# Patient Record
Sex: Male | Born: 1965 | Race: White | Hispanic: No | Marital: Married | Smoking: Former smoker
Health system: Southern US, Community
[De-identification: ages and names within clinical notes are randomized; demographics above are authoritative.]

## PROBLEM LIST (undated history)

## (undated) DIAGNOSIS — K219 Gastro-esophageal reflux disease without esophagitis: Secondary | ICD-10-CM

## (undated) DIAGNOSIS — D6859 Other primary thrombophilia: Secondary | ICD-10-CM

## (undated) DIAGNOSIS — J342 Deviated nasal septum: Secondary | ICD-10-CM

## (undated) DIAGNOSIS — H349 Unspecified retinal vascular occlusion: Secondary | ICD-10-CM

## (undated) DIAGNOSIS — Z8673 Personal history of transient ischemic attack (TIA), and cerebral infarction without residual deficits: Secondary | ICD-10-CM

## (undated) DIAGNOSIS — I639 Cerebral infarction, unspecified: Secondary | ICD-10-CM

## (undated) DIAGNOSIS — E782 Mixed hyperlipidemia: Secondary | ICD-10-CM

## (undated) HISTORY — DX: Other primary thrombophilia: D68.59

## (undated) HISTORY — DX: Mixed hyperlipidemia: E78.2

## (undated) HISTORY — DX: Unspecified retinal vascular occlusion: H34.9

## (undated) HISTORY — DX: Gastro-esophageal reflux disease without esophagitis: K21.9

## (undated) HISTORY — DX: Cerebral infarction, unspecified: I63.9

## (undated) HISTORY — PX: OTHER SURGICAL HISTORY: SHX169

## (undated) HISTORY — DX: Deviated nasal septum: J34.2

## (undated) HISTORY — DX: Personal history of transient ischemic attack (TIA), and cerebral infarction without residual deficits: Z86.73

---

## 2007-08-20 ENCOUNTER — Ambulatory Visit: Payer: Self-pay | Admitting: Unknown Physician Specialty

## 2009-10-06 ENCOUNTER — Ambulatory Visit: Payer: Self-pay | Admitting: Family Medicine

## 2010-12-08 ENCOUNTER — Ambulatory Visit: Payer: Self-pay | Admitting: Internal Medicine

## 2016-04-02 DIAGNOSIS — I639 Cerebral infarction, unspecified: Secondary | ICD-10-CM | POA: Insufficient documentation

## 2016-04-03 DIAGNOSIS — D649 Anemia, unspecified: Secondary | ICD-10-CM | POA: Insufficient documentation

## 2016-04-04 DIAGNOSIS — I7771 Dissection of carotid artery: Secondary | ICD-10-CM | POA: Insufficient documentation

## 2016-04-12 DIAGNOSIS — Z8673 Personal history of transient ischemic attack (TIA), and cerebral infarction without residual deficits: Secondary | ICD-10-CM | POA: Insufficient documentation

## 2016-04-12 DIAGNOSIS — H3410 Central retinal artery occlusion, unspecified eye: Secondary | ICD-10-CM | POA: Insufficient documentation

## 2016-04-21 DIAGNOSIS — Z8679 Personal history of other diseases of the circulatory system: Secondary | ICD-10-CM | POA: Insufficient documentation

## 2016-05-10 ENCOUNTER — Encounter: Payer: Self-pay | Admitting: *Deleted

## 2016-05-10 ENCOUNTER — Other Ambulatory Visit: Payer: Self-pay | Admitting: *Deleted

## 2016-05-10 ENCOUNTER — Encounter: Payer: Self-pay | Admitting: Internal Medicine

## 2016-05-10 ENCOUNTER — Inpatient Hospital Stay: Payer: 59 | Attending: Internal Medicine | Admitting: Internal Medicine

## 2016-05-10 VITALS — BP 132/77 | HR 78 | Temp 98.3°F | Resp 18 | Wt 177.9 lb

## 2016-05-10 DIAGNOSIS — I7771 Dissection of carotid artery: Secondary | ICD-10-CM | POA: Insufficient documentation

## 2016-05-10 DIAGNOSIS — Z87891 Personal history of nicotine dependence: Secondary | ICD-10-CM | POA: Diagnosis not present

## 2016-05-10 DIAGNOSIS — Z7982 Long term (current) use of aspirin: Secondary | ICD-10-CM | POA: Insufficient documentation

## 2016-05-10 DIAGNOSIS — E782 Mixed hyperlipidemia: Secondary | ICD-10-CM | POA: Diagnosis not present

## 2016-05-10 DIAGNOSIS — Z8673 Personal history of transient ischemic attack (TIA), and cerebral infarction without residual deficits: Secondary | ICD-10-CM | POA: Insufficient documentation

## 2016-05-10 DIAGNOSIS — Z79899 Other long term (current) drug therapy: Secondary | ICD-10-CM | POA: Diagnosis not present

## 2016-05-10 DIAGNOSIS — D6859 Other primary thrombophilia: Secondary | ICD-10-CM | POA: Insufficient documentation

## 2016-05-10 DIAGNOSIS — K219 Gastro-esophageal reflux disease without esophagitis: Secondary | ICD-10-CM | POA: Insufficient documentation

## 2016-05-10 NOTE — Progress Notes (Signed)
[facility] CONSULT NOTE  No care team member to display  CHIEF COMPLAINTS/PURPOSE OF CONSULTATION:   # RIGHT MCA stroke   HISTORY OF PRESENTING ILLNESS:  Curtis Mcknight 50 y.o.  male with no significant past medical history noted to have sudden onset of right visual defect; subsequently right MCA stroke with left-sided weakness/neurologic deficits. CTA showed complete occlusion of the right internal carotid artery status post thrombectomy; balloon angioplasty and stent for the ICA dissection. Patient is currently on aspirin and Plavix. He is currently at home; recovered most of his neurologic deficits.  He has been referred to hematology for further evaluation. patient has not previous history of strokes. Has not had any Family history of any venous thromboembolic. No previous history of any DVTs or PEs. He does not smoke; no history of hypertension.  ROS: A complete 10 point review of system is done which is negative except mentioned above in history of present illness  MEDICAL HISTORY:  Past Medical History  Diagnosis Date  . History of stroke   . GERD (gastroesophageal reflux disease)   . Arterial ischemic stroke (HCC)   . Deviated septum   . Mixed hyperlipidemia   . Retinal artery occlusion     SURGICAL HISTORY: Past Surgical History  Procedure Laterality Date  . Internal carotid artery disection      SOCIAL HISTORY: Lives in [place]; No smoking occasional alcohol. Lives with family. Works for [employer] Social History   Social History  . Marital Status: Married    Spouse Name: N/A  . Number of Children: N/A  . Years of Education: N/A   Occupational History  . Not on file.   Social History Main Topics  . Smoking status: Former Smoker -- 0.50 packs/day for 1 years    Types: Cigarettes    Quit date: 01/06/1984  . Smokeless tobacco: Never Used  . Alcohol Use: No  . Drug Use: No  . Sexual Activity: Not on file   Other Topics Concern  . Not on file    Social History Narrative    FAMILY HISTORY: Family History  Problem Relation Age of Onset  . CAD Maternal Grandmother   . CAD Mother   . Glaucoma Maternal Grandfather   . Stroke Maternal Grandfather     ALLERGIES:  has No Known Allergies.  MEDICATIONS:  Current Outpatient Prescriptions  Medication Sig Dispense Refill  . aspirin EC 81 MG tablet Take 1 tablet by mouth daily.    Marland Kitchen atorvastatin (LIPITOR) 40 MG tablet Take 1 tablet by mouth daily.  5  . chlorproMAZINE (THORAZINE) 25 MG tablet Take 1 tablet by mouth 3 (three) times daily.  0  . clopidogrel (PLAVIX) 75 MG tablet Take 1 tablet by mouth daily.    . metoCLOPramide (REGLAN) 10 MG tablet Take 1 tablet by mouth 3 (three) times daily.  0  . Multiple Vitamins-Minerals (MULTIVITAMIN WITH MINERALS) tablet Take 1 tablet by mouth daily.    . pantoprazole (PROTONIX) 40 MG tablet Take 1 tablet by mouth daily.  11   No current facility-administered medications for this visit.      Marland Kitchen  PHYSICAL EXAMINATION:   Filed Vitals:   05/10/16 1130  BP: 132/77  Pulse: 78  Temp: 98.3 F (36.8 C)   Filed Weights   05/10/16 1130  Weight: 177 lb 14.6 oz (80.7 kg)    GENERAL: Well-nourished well-developed; Alert, no distress and comfortable.   Alone.  EYES: no pallor or icterus OROPHARYNX: no thrush  or ulceration; good dentition  NECK: supple, no masses felt LYMPH:  no palpable lymphadenopathy in the cervical, axillary or inguinal regions LUNGS: clear to auscultation and  No wheeze or crackles HEART/CVS: regular rate & rhythm and no murmurs; No lower extremity edema ABDOMEN: abdomen soft, non-tender and normal bowel sounds Musculoskeletal:no cyanosis of digits and no clubbing  PSYCH: alert & oriented x 3 with fluent speech NEURO: no focal motor/sensory deficits SKIN:  no rashes or significant lesions  LABORATORY DATA:  I have reviewed the data as listed No results found for: WBC, HGB, HCT, MCV, PLT No results for input(s):  NA, K, CL, CO2, GLUCOSE, BUN, CREATININE, CALCIUM, GFRNONAA, GFRAA, PROT, ALBUMIN, AST, ALT, ALKPHOS, BILITOT, BILIDIR, IBILI in the last 8760 hours.   ASSESSMENT & PLAN:   #Primary hypercoagulable state-  Stroke- Right carotid dissection/occlusion- status post stenting. Patient's visual deficits improved. Patient is on aspirin and Plavix. Discussed that primary hematologic/hypercoagulable state is unlikely to cause of his stroke. However checked factor V Leiden prothrombin gene mutation and antiphospholipid antibody panel to complete the workup. Patient agrees.  # Patient will follow-up with me in approximately 2 weeks to review the labs/he'll have labs done [facility].  All questions were answered. The patient knows to call the clinic with any problems, questions or concerns.  # 30 minutes face-to-face with the patient discussing the above plan of care; more than 50% of time spent on counseling and coordination.    Earna CoderGovinda R Brahmanday, MD 05/10/2016 12:23 PM

## 2016-05-11 ENCOUNTER — Encounter: Payer: Self-pay | Admitting: Physical Therapy

## 2016-05-11 ENCOUNTER — Ambulatory Visit: Payer: 59 | Attending: Neurology | Admitting: Physical Therapy

## 2016-05-11 DIAGNOSIS — M6281 Muscle weakness (generalized): Secondary | ICD-10-CM | POA: Diagnosis not present

## 2016-05-11 DIAGNOSIS — I639 Cerebral infarction, unspecified: Secondary | ICD-10-CM | POA: Diagnosis present

## 2016-05-12 NOTE — Therapy (Signed)
[place] [facility] [address] [place], [place], [address] Phone: [phone]   Fax:  [phone]  Physical Therapy Evaluation  Patient Details  Name: Curtis Mcknight MRN: [id number] Date of Birth: 03-31-1966 Referring Provider: Dr. Malvin JohnsPotter  Encounter Date: 05/11/2016      PT End of Session - 05/12/16 1953    Visit Number 1   Number of Visits 1   Date for PT Re-Evaluation 05/12/16   PT Start Time 1605   PT Stop Time 1731   PT Time Calculation (min) 86 min   Activity Tolerance Patient tolerated treatment well   Behavior During Therapy [facility] for tasks assessed/performed      Past Medical History  Diagnosis Date  . History of stroke   . GERD (gastroesophageal reflux disease)   . Arterial ischemic stroke (HCC)   . Deviated septum   . Mixed hyperlipidemia   . Retinal artery occlusion   . Primary hypercoagulable state [facility](HCC)     Past Surgical History  Procedure Laterality Date  . Internal carotid artery disection      There were no vitals filed for this visit.       Subjective Assessment - 05/11/16 1612    Subjective See FCE.  Occulusion of R eye on 03/31/16 and ischemic stroke on 04/01/16   Currently in Pain? No/denies            Plan - 05/12/16 1954    Clinical Impression Statement See FCE   Rehab Potential Excellent   PT Frequency 1x / week   PT Treatment/Interventions Therapeutic exercise   PT Next Visit Plan FCE only      Patient will benefit from skilled therapeutic intervention in order to improve the following deficits and impairments:  Decreased strength  Visit Diagnosis: Muscle weakness (generalized)  Ischemic stroke [facility](HCC)     Problem List Patient Active Problem List   Diagnosis Date Noted  . Personal history of other diseases of the circulatory system 04/21/2016  . Central artery occlusion of retina 04/12/2016  . Cerebrovascular accident, old 04/12/2016  . Dissection of carotid artery (HCC)  04/04/2016  . Absolute anemia 04/03/2016  . Ischemic stroke (HCC) 04/02/2016   Cammie McgeeMichael C Sherk, PT, DPT # [phone]   05/12/2016, 7:56 PM  [place] [facility] [address] [place], [place], [address] Phone: [phone]   Fax:  [phone]  Name: Curtis Mcknight MRN: [id number] Date of Birth: 03-31-1966

## 2016-05-20 ENCOUNTER — Other Ambulatory Visit: Payer: Self-pay | Admitting: Internal Medicine

## 2016-05-20 NOTE — Progress Notes (Signed)
Reviewed hyper-coag workup [05/17/2016]-prothrombin gene mutation heterozygosity noted/abnormal; no factor V Leiden mutation and antiphospholipid antibody syndrome workup negative. Dr.B

## 2016-05-24 ENCOUNTER — Inpatient Hospital Stay: Payer: 59 | Admitting: Internal Medicine

## 2016-06-14 ENCOUNTER — Inpatient Hospital Stay: Payer: 59 | Attending: Internal Medicine | Admitting: Internal Medicine

## 2016-06-14 VITALS — BP 123/81 | HR 72 | Temp 97.4°F | Resp 18 | Wt 177.0 lb

## 2016-06-14 DIAGNOSIS — D6859 Other primary thrombophilia: Secondary | ICD-10-CM

## 2016-06-14 NOTE — Assessment & Plan Note (Addendum)
#   Patient's hypercoagulable work-up positive for prothrombin gene mutation heterozygous; negative for factor V Leiden/negative for antiphospholipid antibody syndrome.  I do not suspect patient's prothrombin gene heterozygosity as a cause of his stroke.  No further work-up is recommended.   #Recommend continued treatment of his stroke/follow-up with neurology.  No further recommendations from hematology standpoint.  Follow-up as needed.

## 2016-06-14 NOTE — Progress Notes (Signed)
[facility] CONSULT NOTE  Patient Care Team: Titus MouldWhite, Elizabeth Burney, NP as PCP - General (Family Medicine)  CHIEF COMPLAINTS/PURPOSE OF CONSULTATION:   # 2017- RIGHT MCA stroke- CTA showed complete occlusion of the right internal carotid artery status post thrombectomy; balloon angioplasty and stent for the ICA dissection.on asprin/plavix  #Hypercoagulable work-up -prothrombin gene heterozygosity; negative factor V Leiden/negative for antiphospholipid antibody syndrome     HISTORY OF PRESENTING ILLNESS:  Curtis Mcknight 50 y.o.  male history of right-sided MCA stroke-with mild left sided neurologic deficits here for follow-up/review the results of his hypercoagulable work-up.  He is currently improving.  No strokelike symptoms.  Motor strength improving.  ROS: A complete 10 point review of system is done which is negative except mentioned above in history of present illness  MEDICAL HISTORY:  Past Medical History:  Diagnosis Date  . Arterial ischemic stroke (HCC)   . Deviated septum   . GERD (gastroesophageal reflux disease)   . History of stroke   . Mixed hyperlipidemia   . Primary hypercoagulable state (HCC)   . Retinal artery occlusion     SURGICAL HISTORY: Past Surgical History:  Procedure Laterality Date  . internal carotid artery disection      SOCIAL HISTORY: [place]; No smoking occasional alcohol. Lives with family. Works for [employer] Social History   Socioeconomic History  . Marital status: Married    Spouse name: Not on file  . Number of children: Not on file  . Years of education: Not on file  . Highest education level: Not on file  Occupational History  . Not on file  Social Needs  . Financial resource strain: Not on file  . Food insecurity:    Worry: Not on file    Inability: Not on file  . Transportation needs:    Medical: Not on file    Non-medical: Not on file  Tobacco Use  . Smoking status: Former Smoker   Packs/day: 0.50    Years: 1.00    Pack years: 0.50    Types: Cigarettes    Last attempt to quit: 01/06/1984    Years since quitting: 34.5  . Smokeless tobacco: Never Used  Substance and Sexual Activity  . Alcohol use: No    Alcohol/week: 0.0 standard drinks  . Drug use: No  . Sexual activity: Not on file  Lifestyle  . Physical activity:    Days per week: Not on file    Minutes per session: Not on file  . Stress: Not on file  Relationships  . Social connections:    Talks on phone: Not on file    Gets together: Not on file    Attends religious service: Not on file    Active member of club or organization: Not on file    Attends meetings of clubs or organizations: Not on file    Relationship status: Not on file  . Intimate partner violence:    Fear of current or ex partner: Not on file    Emotionally abused: Not on file    Physically abused: Not on file    Forced sexual activity: Not on file  Other Topics Concern  . Not on file  Social History Narrative  . Not on file    FAMILY HISTORY: Family History  Problem Relation Age of Onset  . CAD Maternal Grandmother   . CAD Mother   . Glaucoma Maternal Grandfather   . Stroke Maternal Grandfather  ALLERGIES:  has No Known Allergies.  MEDICATIONS:  Current Outpatient Medications  Medication Sig Dispense Refill  . Multiple Vitamins-Minerals (MULTIVITAMIN WITH MINERALS) tablet Take 1 tablet by mouth daily.     No current facility-administered medications for this visit.       Marland Kitchen  PHYSICAL EXAMINATION:   Vitals:   06/14/16 1101  BP: 123/81  Pulse: 72  Resp: 18  Temp: 97.4 F (36.3 C)   Filed Weights   06/14/16 1101  Weight: 177 lb 0.5 oz (80.3 kg)    GENERAL: Well-nourished well-developed; Alert, no distress and comfortable.   Alone.  EYES: no pallor or icterus OROPHARYNX: no thrush or ulceration; good dentition  NECK: supple, no masses felt LYMPH:  no palpable lymphadenopathy in the cervical, axillary  or inguinal regions LUNGS: clear to auscultation and  No wheeze or crackles HEART/CVS: regular rate & rhythm and no murmurs; No lower extremity edema ABDOMEN: abdomen soft, non-tender and normal bowel sounds Musculoskeletal:no cyanosis of digits and no clubbing  PSYCH: alert & oriented x 3 with fluent speech NEURO: Mild weakness on the left side.  SKIN:  no rashes or significant lesions  LABORATORY DATA:  I have reviewed the data as listed No results found for: WBC, HGB, HCT, MCV, PLT No results for input(s): NA, K, CL, CO2, GLUCOSE, BUN, CREATININE, CALCIUM, GFRNONAA, GFRAA, PROT, ALBUMIN, AST, ALT, ALKPHOS, BILITOT, BILIDIR, IBILI in the last 8760 hours.   ASSESSMENT & PLAN:   Primary hypercoagulable state (HCC) # Patient's hypercoagulable work-up positive for prothrombin gene mutation heterozygous; negative for factor V Leiden/negative for antiphospholipid antibody syndrome.  I do not suspect patient's prothrombin gene heterozygosity as a cause of his stroke.  No further work-up is recommended.   #Recommend continued treatment of his stroke/follow-up with neurology.  No further recommendations from hematology standpoint.  Follow-up as needed.      Earna Coder, MD 07/30/2018 11:24 PM

## 2016-09-15 ENCOUNTER — Ambulatory Visit (INDEPENDENT_AMBULATORY_CARE_PROVIDER_SITE_OTHER): Payer: Self-pay | Admitting: Vascular Surgery

## 2017-03-30 ENCOUNTER — Other Ambulatory Visit: Payer: Self-pay | Admitting: Neurology

## 2017-03-30 DIAGNOSIS — I69398 Other sequelae of cerebral infarction: Secondary | ICD-10-CM

## 2017-03-30 DIAGNOSIS — H3411 Central retinal artery occlusion, right eye: Secondary | ICD-10-CM

## 2017-03-30 DIAGNOSIS — R42 Dizziness and giddiness: Secondary | ICD-10-CM

## 2017-04-14 ENCOUNTER — Ambulatory Visit: Payer: 59

## 2019-01-14 ENCOUNTER — Other Ambulatory Visit: Payer: Self-pay | Admitting: Neurology

## 2019-01-14 ENCOUNTER — Other Ambulatory Visit (HOSPITAL_COMMUNITY): Payer: Self-pay | Admitting: Neurology

## 2019-01-14 DIAGNOSIS — Z8679 Personal history of other diseases of the circulatory system: Secondary | ICD-10-CM

## 2019-01-17 ENCOUNTER — Ambulatory Visit
Admission: RE | Admit: 2019-01-17 | Discharge: 2019-01-17 | Disposition: A | Discharge status: Home / Self Care | Payer: Managed Care, Other (non HMO) | Source: Ambulatory Visit | Attending: Neurology | Admitting: Neurology

## 2019-01-17 DIAGNOSIS — Z8679 Personal history of other diseases of the circulatory system: Secondary | ICD-10-CM | POA: Insufficient documentation

## 2019-01-17 MED ORDER — IOPAMIDOL (ISOVUE-370) INJECTION 76%
75.0000 mL | Freq: Once | INTRAVENOUS | Status: AC | PRN
  Administered 2019-01-17: 75 mL via INTRAVENOUS

## 2020-10-06 IMAGING — CT CT ANGIO HEAD
1 of 12 series · 4 of 33 positions shown · IV contrast (iopamidol)
Comparison: Carotid Doppler ultrasound 04/11/2017

CLINICAL DATA: Personal history of right carotid artery dissection.
Recent change in sensation of the right neck. Symptoms initially
occurred with provocation, now more constant.

EXAM:
CT ANGIOGRAPHY HEAD AND NECK
TECHNIQUE: Multidetector CT imaging of the head and neck was performed using
the standard protocol during bolus administration of intravenous
contrast. Multiplanar CT image reconstructions and MIPs were
obtained to evaluate the vascular anatomy. Carotid stenosis
measurements (when applicable) are obtained utilizing NASCET
criteria, using the distal internal carotid diameter as the
denominator.
CONTRAST:  75mL 1F9VC5-CSI IOPAMIDOL (1F9VC5-CSI) INJECTION 76%

[Series 15: ax thin mips cta head & neck (person_name) · axial · 0.56mm/px · z∈[-763,-532]mm · 4 of 387 slices shown]
[im 78/387  soft-tissue]
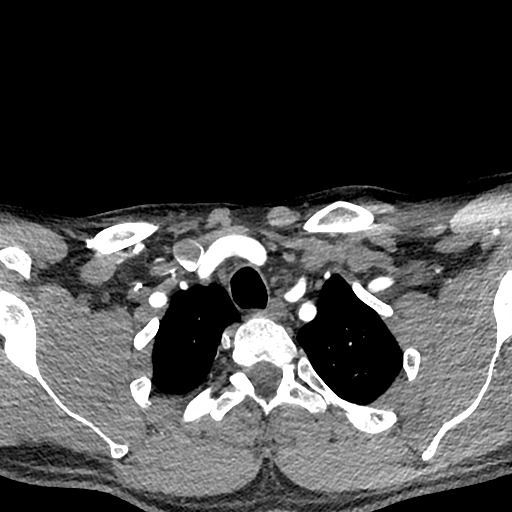
[im 155/387  bone]
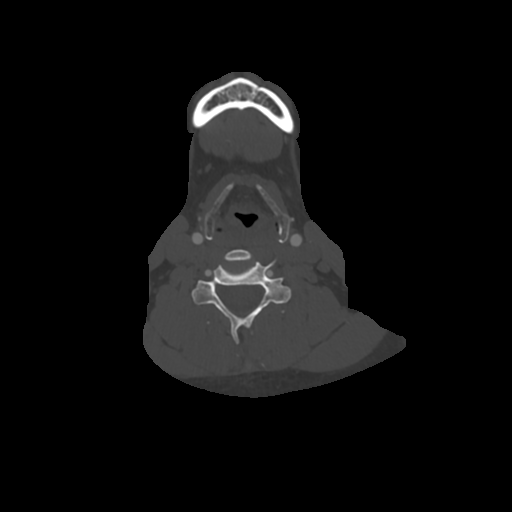
[im 232/387  soft-tissue]
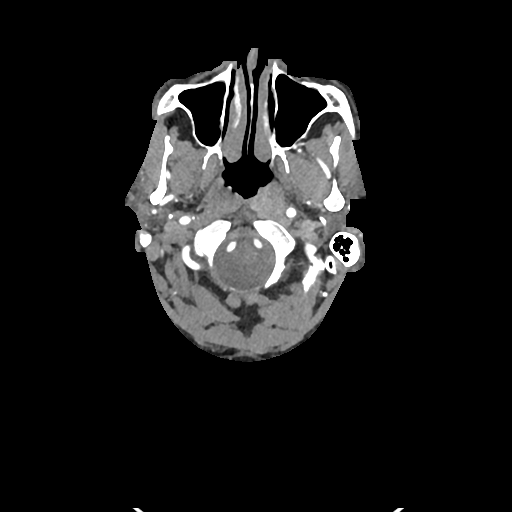
[im 309/387  bone]
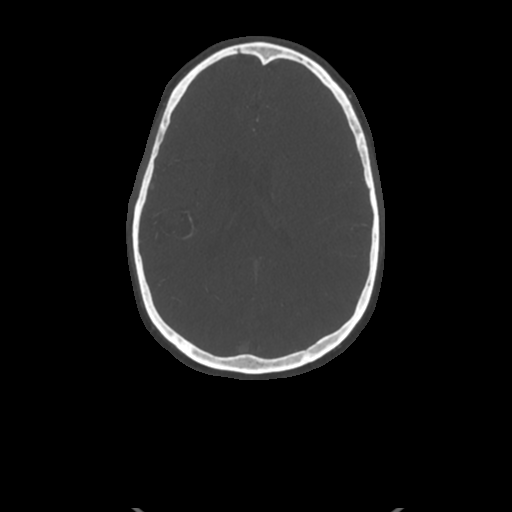

[4 of 33 positions shown; findings below may reference images not displayed]

FINDINGS: CT HEAD FINDINGS

Brain: Noncontrast imaging the brain demonstrates remote infarct
involving the right lentiform nucleus. There is volume loss in the
right basal ganglia with ex vacuo dilation of the right lateral
ventricle and sylvian fissure. There is also encephalomalacia
involving the right temporal tip and insular cortex.

Left basal ganglia are intact. Acute intracranial abnormality is
present. There is no hemorrhage or mass lesion.

Vascular: No hyperdense vessel or unexpected calcification.

Skull: Calvarium is intact. No focal lytic or blastic lesions are
present.

Sinuses: The paranasal sinuses and mastoid air cells are clear.

Orbits: The globes and orbits are within normal limits.

Review of the MIP images confirms the above findings

CTA NECK FINDINGS

Aortic arch: A 3 vessel arch configuration is present.

Right carotid system: Right common carotid artery is within normal
limits. The bifurcation is unremarkable. A stent is noted in the
distal cervical right ICA. The stent is patent. No in stent stenosis
is present. There is no evidence for recurrent dissection.

Left carotid system: The left common carotid artery is within normal
limits. Bifurcation is unremarkable. The cervical left ICA is
normal.

Vertebral arteries: Vertebral arteries originate from the subclavian
arteries bilaterally without significant stenosis. Vertebral
arteries are codominant. There is no significant stenosis of either
vertebral artery in the neck.

Skeleton: Mild endplate degenerative changes are most evident at
C5-6. Mild osseous foraminal narrowing is present on the right at
C5-6. There is slight anterolisthesis at C4-5.

Other neck: Soft tissues the neck are otherwise unremarkable. No
focal mucosal or submucosal lesions are present. Salivary glands are
normal. There is mild heterogeneity of the thyroid without a
dominant lesion. No significant adenopathy is present.

Upper chest: The lung apices are clear.  Thoracic inlet is normal.

Review of the MIP images confirms the above findings

CTA HEAD FINDINGS

Anterior circulation: The right ICA stent extends to the skull base.
The right internal carotid artery is normal distal to the stent
through the ICA terminus. Left ICA is normal from the skull base
through the ICA terminus. The A1 and M1 segments are normal.
Anterior communicating artery is patent. The left A1 segment is
dominant. MCA bifurcations are within normal limits bilaterally. ACA
and MCA bifurcations are normal bilaterally.

Posterior circulation: There is mild narrowing of the distal left V4
segment without significant stenosis elsewhere. PICA origins are
visualized and normal. Vertebrobasilar junction is normal. The
basilar artery is normal. The right posterior cerebral artery is fed
predominantly by the posterior communicating artery. A small left P1
segment is present. The left posterior cerebral artery originates
from the basilar tip. PCA branch vessels are normal bilaterally.

Venous sinuses: Dural sinuses are patent. Straight sinus deep
cerebral veins are intact. Cortical veins are within normal limits.

Anatomic variants: Fetal type right posterior cerebral artery.

Delayed phase: Postcontrast images demonstrate no pathologic
enhancement.

Review of the MIP images confirms the above findings
IMPRESSION: 1. Remote infarcts involving the right basal ganglia and temporal
tip.
2. No acute intracranial abnormality.
3. Patent stent in the distal right ICA at the skull base without
evidence for residual or recurrent stenosis or dissection.
4. Mild narrowing of less than 50% in the distal left vertebral
artery, V4 segment.
5. Mild degenerative changes of the cervical spine are most evident
at C5-6.

## 2024-04-14 NOTE — Progress Notes (Unsigned)
 MRN : [id number]  Curtis Mcknight is a 58 y.o. ([birth year]) male who presents with chief complaint of check carotid arteries.  History of Present Illness:  The patient is seen for evaluation of carotid stenosis. The carotid stenosis was identified after the patient experienced a right hemispheric CVA.  The patient has a history of carotid artery stenting for carotid artery dissection.  The patient denies amaurosis fugax. There is no recent history of TIA symptoms or focal motor deficits. There is no prior documented CVA.  There is no history of migraine headaches. There is no history of seizures.  The patient is taking enteric-coated aspirin 81 mg daily.  No recent shortening of the patient's walking distance or new symptoms consistent with claudication.  No history of rest pain symptoms. No new ulcers or wounds of the lower extremities have occurred.  There is no history of DVT, PE or superficial thrombophlebitis. No recent episodes of angina or shortness of breath documented.    No outpatient medications have been marked as taking for the 04/15/24 encounter (Appointment) with Prescilla Brod, Ninette Basque, MD.    Past Medical History:  Diagnosis Date   Arterial ischemic stroke (HCC)    Deviated septum    GERD (gastroesophageal reflux disease)    History of stroke    Mixed hyperlipidemia    Primary hypercoagulable state (HCC)    Retinal artery occlusion     Past Surgical History:  Procedure Laterality Date   internal carotid artery disection      Social History Social History   Tobacco Use   Smoking status: Former    Current packs/day: 0.00    Average packs/day: 0.5 packs/day for 1 year (0.5 ttl pk-yrs)    Types: Cigarettes    Start date: 01/05/1983    Quit date: 01/06/1984    Years since quitting: 40.2   Smokeless tobacco: Never  Substance Use Topics   Alcohol use: No    Alcohol/week: 0.0 standard drinks of alcohol   Drug use: No    Family  History Family History  Problem Relation Age of Onset   CAD Maternal Grandmother    CAD Mother    Glaucoma Maternal Grandfather    Stroke Maternal Grandfather     No Known Allergies   REVIEW OF SYSTEMS (Negative unless checked)  Constitutional: [] Weight loss  [] Fever  [] Chills Cardiac: [] Chest pain   [] Chest pressure   [] Palpitations   [] Shortness of breath when laying flat   [] Shortness of breath with exertion. Vascular:  [x] Pain in legs with walking   [] Pain in legs at rest  [] History of DVT   [] Phlebitis   [] Swelling in legs   [] Varicose veins   [] Non-healing ulcers Pulmonary:   [] Uses home oxygen   [] Productive cough   [] Hemoptysis   [] Wheeze  [] COPD   [] Asthma Neurologic:  [] Dizziness   [] Seizures   [] History of stroke   [] History of TIA  [] Aphasia   [] Vissual changes   [] Weakness or numbness in arm   [] Weakness or numbness in leg Musculoskeletal:   [] Joint swelling   [] Joint pain   [] Low back pain Hematologic:  [] Easy bruising  [] Easy bleeding   [] Hypercoagulable state   [] Anemic Gastrointestinal:  [] Diarrhea   [] Vomiting  [] Gastroesophageal reflux/heartburn   [] Difficulty swallowing. Genitourinary:  [] Chronic kidney disease   [] Difficult urination  [] Frequent urination   [] Blood in  urine Skin:  [] Rashes   [] Ulcers  Psychological:  [] History of anxiety   []  History of major depression.  Physical Examination  There were no vitals filed for this visit. There is no height or weight on file to calculate BMI. Gen: WD/WN, NAD Head: [place]/AT, No temporalis wasting.  Ear/Nose/Throat: Hearing grossly intact, nares w/o erythema or drainage Eyes: PER, EOMI, sclera nonicteric.  Neck: Supple, no masses.  No bruit or JVD.  Pulmonary:  Good air movement, no audible wheezing, no use of accessory muscles.  Cardiac: RRR, normal S1, S2, no Murmurs. Vascular:  carotid bruit noted Vessel Right Left  Radial Palpable Palpable  Carotid  Palpable  Palpable  Subclav  Palpable Palpable   Gastrointestinal: soft, non-distended. No guarding/no peritoneal signs.  Musculoskeletal: M/S 5/5 throughout.  No visible deformity.  Neurologic: CN 2-12 intact. Pain and light touch intact in extremities.  Symmetrical.  Speech is fluent. Motor exam as listed above. Psychiatric: Judgment intact, Mood & affect appropriate for pt's clinical situation. Dermatologic: No rashes or ulcers noted.  No changes consistent with cellulitis.   CBC No results found for: "WBC", "HGB", "HCT", "MCV", "PLT"  BMET No results found for: "NA", "K", "CL", "CO2", "GLUCOSE", "BUN", "CREATININE", "CALCIUM", "GFRNONAA", "GFRAA" CrCl cannot be calculated (No successful lab value found.).  COAG No results found for: "INR", "PROTIME"  Radiology No results found.   Assessment/Plan There are no diagnoses linked to this encounter.   Devon Fogo, MD  04/14/2024 3:01 PM

## 2024-04-15 ENCOUNTER — Encounter (INDEPENDENT_AMBULATORY_CARE_PROVIDER_SITE_OTHER): Payer: Self-pay | Admitting: Vascular Surgery

## 2024-04-15 ENCOUNTER — Ambulatory Visit (INDEPENDENT_AMBULATORY_CARE_PROVIDER_SITE_OTHER): Payer: Self-pay | Admitting: Vascular Surgery

## 2024-04-15 VITALS — BP 145/95 | HR 74 | Resp 18 | Wt 183.2 lb

## 2024-04-15 DIAGNOSIS — I7771 Dissection of carotid artery: Secondary | ICD-10-CM | POA: Diagnosis not present

## 2024-04-15 DIAGNOSIS — D6859 Other primary thrombophilia: Secondary | ICD-10-CM | POA: Diagnosis not present

## 2024-04-16 ENCOUNTER — Encounter (INDEPENDENT_AMBULATORY_CARE_PROVIDER_SITE_OTHER): Payer: Self-pay | Admitting: Vascular Surgery

## 2024-04-22 ENCOUNTER — Ambulatory Visit (INDEPENDENT_AMBULATORY_CARE_PROVIDER_SITE_OTHER)

## 2024-04-22 DIAGNOSIS — I7771 Dissection of carotid artery: Secondary | ICD-10-CM | POA: Diagnosis not present

## 2024-05-01 NOTE — Progress Notes (Unsigned)
 MRN : [id number]  Curtis Mcknight is a 58 y.o. ([birth year]) male who presents with chief complaint of check carotid arteries.  History of Present Illness:   The patient is seen for evaluation of carotid stenosis. The carotid stenosis was identified after the patient experienced a right hemispheric CVA.  Approximately 5 years ago he experienced the sudden onset of right visual defect; subsequently right MCA stroke with left-sided weakness/neurologic deficits. CTA showed complete occlusion of the right internal carotid artery status post thrombectomy; balloon angioplasty and stent for the ICA dissection. Patient is currently on aspirin and Plavix.    The patient denies recent amaurosis fugax. There is no recent history of TIA symptoms or focal motor deficits. There is no prior documented CVA.   There is no history of migraine headaches. There is no history of seizures.   No recent shortening of the patient's walking distance or new symptoms consistent with claudication.  No history of rest pain symptoms. No new ulcers or wounds of the lower extremities have occurred.   There is no history of DVT, PE or superficial thrombophlebitis. No recent episodes of angina or shortness of breath documented.   No outpatient medications have been marked as taking for the 05/02/24 encounter (Appointment) with Prescilla Brod, Ninette Basque, MD.    Past Medical History:  Diagnosis Date   Arterial ischemic stroke (HCC)    Deviated septum    GERD (gastroesophageal reflux disease)    History of stroke    Mixed hyperlipidemia    Primary hypercoagulable state (HCC)    Retinal artery occlusion     Past Surgical History:  Procedure Laterality Date   internal carotid artery disection      Social History Social History   Tobacco Use   Smoking status: Former    Current packs/day: 0.00    Average packs/day: 0.5 packs/day for 1 year (0.5 ttl pk-yrs)    Types: Cigarettes    Start date:  01/05/1983    Quit date: 01/06/1984    Years since quitting: 40.3   Smokeless tobacco: Never  Substance Use Topics   Alcohol use: No    Alcohol/week: 0.0 standard drinks of alcohol   Drug use: No    Family History Family History  Problem Relation Age of Onset   CAD Maternal Grandmother    CAD Mother    Glaucoma Maternal Grandfather    Stroke Maternal Grandfather     No Known Allergies   REVIEW OF SYSTEMS (Negative unless checked)  Constitutional: [] Weight loss  [] Fever  [] Chills Cardiac: [] Chest pain   [] Chest pressure   [] Palpitations   [] Shortness of breath when laying flat   [] Shortness of breath with exertion. Vascular:  [x] Pain in legs with walking   [] Pain in legs at rest  [] History of DVT   [] Phlebitis   [] Swelling in legs   [] Varicose veins   [] Non-healing ulcers Pulmonary:   [] Uses home oxygen   [] Productive cough   [] Hemoptysis   [] Wheeze  [] COPD   [] Asthma Neurologic:  [] Dizziness   [] Seizures   [] History of stroke   [] History of TIA  [] Aphasia   [] Vissual changes   [] Weakness or numbness in arm   [] Weakness or numbness in leg Musculoskeletal:   [] Joint swelling   [] Joint pain   [] Low back pain Hematologic:  [] Easy bruising  [] Easy bleeding   [] Hypercoagulable state   []   Anemic Gastrointestinal:  [] Diarrhea   [] Vomiting  [] Gastroesophageal reflux/heartburn   [] Difficulty swallowing. Genitourinary:  [] Chronic kidney disease   [] Difficult urination  [] Frequent urination   [] Blood in urine Skin:  [] Rashes   [] Ulcers  Psychological:  [] History of anxiety   []  History of major depression.  Physical Examination  There were no vitals filed for this visit. There is no height or weight on file to calculate BMI. Gen: WD/WN, NAD Head: [place]/AT, No temporalis wasting.  Ear/Nose/Throat: Hearing grossly intact, nares w/o erythema or drainage Eyes: PER, EOMI, sclera nonicteric.  Neck: Supple, no masses.  No bruit or JVD.  Pulmonary:  Good air movement, no audible wheezing, no use of  accessory muscles.  Cardiac: RRR, normal S1, S2, no Murmurs. Vascular:  carotid bruit noted Vessel Right Left  Radial Palpable Palpable  Carotid  Palpable  Palpable  Subclav  Palpable Palpable  Gastrointestinal: soft, non-distended. No guarding/no peritoneal signs.  Musculoskeletal: M/S 5/5 throughout.  No visible deformity.  Neurologic: CN 2-12 intact. Pain and light touch intact in extremities.  Symmetrical.  Speech is fluent. Motor exam as listed above. Psychiatric: Judgment intact, Mood & affect appropriate for pt's clinical situation. Dermatologic: No rashes or ulcers noted.  No changes consistent with cellulitis.   CBC No results found for: "WBC", "HGB", "HCT", "MCV", "PLT"  BMET No results found for: "NA", "K", "CL", "CO2", "GLUCOSE", "BUN", "CREATININE", "CALCIUM", "GFRNONAA", "GFRAA" CrCl cannot be calculated (No successful lab value found.).  COAG No results found for: "INR", "PROTIME"  Radiology VAS US  CAROTID Result Date: 04/22/2024 Carotid Arterial Duplex Study Patient Name:  Curtis Mcknight  Date of Exam:   04/22/2024 Medical Rec #: [id number]        Accession #:    [id number] Date of Birth: 01/14/1966       Patient Gender: M Patient Age:   89 years Exam Location:  [facility] Procedure:      VAS US  CAROTID Referring Phys: Devon Fogo --------------------------------------------------------------------------------  Indications:   Carotid artery disease and Right stent. Risk Factors:  Prior CVA. Other Factors: RIght hemispheric CVA. Performing Technologist: Oneta Bilberry RVT  Examination Guidelines: A complete evaluation includes B-mode imaging, spectral Doppler, color Doppler, and power Doppler as needed of all accessible portions of each vessel. Bilateral testing is considered an integral part of a complete examination. Limited examinations for reoccurring indications may be performed as noted.  Right Carotid Findings:  +----------+--------+--------+--------+------------------+--------+           PSV cm/sEDV cm/sStenosisPlaque DescriptionComments +----------+--------+--------+--------+------------------+--------+ CCA Prox  140     16                                         +----------+--------+--------+--------+------------------+--------+ CCA Mid   143     26                                         +----------+--------+--------+--------+------------------+--------+ CCA Distal117     25                                         +----------+--------+--------+--------+------------------+--------+ ICA Prox  70      19      Normal                             +----------+--------+--------+--------+------------------+--------+  ICA Mid   63      23                                         +----------+--------+--------+--------+------------------+--------+ ICA Distal55      18                                         +----------+--------+--------+--------+------------------+--------+ ECA       81      0                                          +----------+--------+--------+--------+------------------+--------+ +----------+--------+-------+----------------+-------------------+           PSV cm/sEDV cmsDescribe        Arm Pressure (mmHG) +----------+--------+-------+----------------+-------------------+ [id number]            Multiphasic, WNL                    +----------+--------+-------+----------------+-------------------+ +---------+--------+--+--------+--+---------+ VertebralPSV cm/s41EDV cm/s10Antegrade +---------+--------+--+--------+--+---------+  Left Carotid Findings: +----------+--------+--------+--------+------------------+--------+           PSV cm/sEDV cm/sStenosisPlaque DescriptionComments +----------+--------+--------+--------+------------------+--------+ CCA Prox  150     18                                          +----------+--------+--------+--------+------------------+--------+ CCA Mid   145     23                                         +----------+--------+--------+--------+------------------+--------+ CCA Distal132     28                                         +----------+--------+--------+--------+------------------+--------+ ICA Prox  87      24      Normal                             +----------+--------+--------+--------+------------------+--------+ ICA Mid   82      31                                         +----------+--------+--------+--------+------------------+--------+ ICA Distal80      31                                         +----------+--------+--------+--------+------------------+--------+ ECA       74      0                                          +----------+--------+--------+--------+------------------+--------+ +----------+--------+--------+----------------+-------------------+  PSV cm/sEDV cm/sDescribe        Arm Pressure (mmHG) +----------+--------+--------+----------------+-------------------+ [id number]             Multiphasic, WNL                    +----------+--------+--------+----------------+-------------------+ +---------+--------+--+--------+-+---------+ VertebralPSV cm/s37EDV cm/s7Antegrade +---------+--------+--+--------+-+---------+   Summary: Right Carotid: There is no evidence of stenosis in the right ICA. Left Carotid: There is no evidence of stenosis in the left ICA. Vertebrals:  Bilateral vertebral arteries demonstrate antegrade flow. Subclavians: Normal flow hemodynamics were seen in bilateral subclavian              arteries. *See table(s) above for measurements and observations.  Electronically signed by Devon Fogo MD on 04/22/2024 at 5:00:50 PM.    Final      Assessment/Plan There are no diagnoses linked to this encounter.   Devon Fogo, MD  05/01/2024 8:20 AM

## 2024-05-02 ENCOUNTER — Ambulatory Visit (INDEPENDENT_AMBULATORY_CARE_PROVIDER_SITE_OTHER): Admitting: Vascular Surgery

## 2024-05-02 VITALS — BP 117/80 | HR 74 | Resp 16 | Ht 69.0 in | Wt 181.4 lb

## 2024-05-02 DIAGNOSIS — I7771 Dissection of carotid artery: Secondary | ICD-10-CM

## 2024-05-02 DIAGNOSIS — D6859 Other primary thrombophilia: Secondary | ICD-10-CM | POA: Diagnosis not present

## 2024-05-03 ENCOUNTER — Encounter (INDEPENDENT_AMBULATORY_CARE_PROVIDER_SITE_OTHER): Payer: Self-pay | Admitting: Vascular Surgery

## 2025-05-01 ENCOUNTER — Encounter (INDEPENDENT_AMBULATORY_CARE_PROVIDER_SITE_OTHER)

## 2025-05-01 ENCOUNTER — Ambulatory Visit (INDEPENDENT_AMBULATORY_CARE_PROVIDER_SITE_OTHER): Admitting: Vascular Surgery
